# Patient Record
Sex: Male | Born: 1980 | Race: White | Hispanic: No | Marital: Single | Smoking: Former smoker
Health system: Southern US, Community
[De-identification: ages and names within clinical notes are randomized; demographics above are authoritative.]

## PROBLEM LIST (undated history)

## (undated) DIAGNOSIS — F329 Major depressive disorder, single episode, unspecified: Secondary | ICD-10-CM

## (undated) DIAGNOSIS — T7840XA Allergy, unspecified, initial encounter: Secondary | ICD-10-CM

## (undated) DIAGNOSIS — F32A Depression, unspecified: Secondary | ICD-10-CM

## (undated) DIAGNOSIS — I10 Essential (primary) hypertension: Secondary | ICD-10-CM

## (undated) DIAGNOSIS — F900 Attention-deficit hyperactivity disorder, predominantly inattentive type: Secondary | ICD-10-CM

## (undated) DIAGNOSIS — N189 Chronic kidney disease, unspecified: Secondary | ICD-10-CM

## (undated) HISTORY — DX: Allergy, unspecified, initial encounter: T78.40XA

## (undated) HISTORY — DX: Essential (primary) hypertension: I10

## (undated) HISTORY — DX: Major depressive disorder, single episode, unspecified: F32.9

## (undated) HISTORY — DX: Chronic kidney disease, unspecified: N18.9

## (undated) HISTORY — DX: Attention-deficit hyperactivity disorder, predominantly inattentive type: F90.0

## (undated) HISTORY — DX: Depression, unspecified: F32.A

---
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
## 2002-01-02 ENCOUNTER — Encounter: Admission: RE | Admit: 2002-01-02 | Discharge: 2002-04-02 | Payer: Self-pay | Admitting: Family Medicine

## 2004-03-08 ENCOUNTER — Emergency Department (HOSPITAL_COMMUNITY): Admission: EM | Admit: 2004-03-08 | Discharge: 2004-03-08 | Payer: Self-pay | Admitting: Emergency Medicine

## 2004-03-09 ENCOUNTER — Emergency Department (HOSPITAL_COMMUNITY): Admission: EM | Admit: 2004-03-09 | Discharge: 2004-03-09 | Payer: Self-pay | Admitting: Emergency Medicine

## 2006-10-23 ENCOUNTER — Ambulatory Visit (HOSPITAL_COMMUNITY): Admission: RE | Admit: 2006-10-23 | Discharge: 2006-10-23 | Payer: Self-pay | Admitting: *Deleted

## 2008-01-24 ENCOUNTER — Ambulatory Visit: Payer: Self-pay | Admitting: Internal Medicine

## 2008-01-24 LAB — CONVERTED CEMR LAB
ALT: 122 units/L — ABNORMAL HIGH (ref 0–53)
AST: 89 units/L — ABNORMAL HIGH (ref 0–37)
Alkaline Phosphatase: 75 units/L (ref 39–117)
Bilirubin, Direct: 0.1 mg/dL (ref 0.0–0.3)
Total Bilirubin: 0.9 mg/dL (ref 0.3–1.2)
Total Protein: 7.4 g/dL (ref 6.0–8.3)

## 2009-01-29 ENCOUNTER — Emergency Department (HOSPITAL_COMMUNITY): Admission: EM | Admit: 2009-01-29 | Discharge: 2009-01-29 | Payer: Self-pay | Admitting: Emergency Medicine

## 2009-12-01 ENCOUNTER — Encounter: Admission: RE | Admit: 2009-12-01 | Discharge: 2009-12-01 | Payer: Self-pay | Admitting: Gastroenterology

## 2011-02-17 LAB — POCT URINALYSIS DIP (DEVICE)
Glucose, UA: NEGATIVE mg/dL
Nitrite: NEGATIVE
Protein, ur: NEGATIVE mg/dL
Specific Gravity, Urine: 1.02 (ref 1.005–1.030)
Urobilinogen, UA: 0.2 mg/dL (ref 0.0–1.0)
pH: 7 (ref 5.0–8.0)

## 2011-03-22 NOTE — Assessment & Plan Note (Signed)
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  Other intrinsic liver processes are certainly  possible, as well, and again, pending review of the labs drawn today,  will determine other workup.     Iva Boop, MD,FACG     CEG/MedQ  DD: 01/25/2008  DT: 01/25/2008  Job #: [id number]   cc:   Garnetta Buddy, M.D.  Dr. Cleone Slim Anger

## 2011-03-25 NOTE — Assessment & Plan Note (Signed)
[facility]                         GASTROENTEROLOGY OFFICE NOTE   NAME:JONESShant, [name]                        MRN:          [id number]  DATE:01/24/2008                            DOB:          18-Jul-1981    REFERRING PHYSICIAN:  Garnetta Buddy, M.D.   PRIMARY CARE PHYSICIAN:  Bryan Lemma. Manus Gunning, M.D.   REASON FOR CONSULTATION:  Abnormal LFTs.   ASSESSMENT:  A 30 year old white male with abnormal transaminases. The  most likely etiology is alcoholic-induced transaminitis, perhaps  alcoholic hepatitis. However, his ALT is greater than his AST which goes  against that. He was drinking rather heavily, he openly admits, but now  is only drinking several beers a week on the weekends.   RECOMMENDATIONS AND PLAN:  Repeat LFTs. He has a tattoo. Will go ahead  and check a hepatitis C antibody and hepatitis B surface antigen as  well. If he has persistent increase in LFTs, further workup could  certainly be indicated. However, since he has not completely abstained  from alcohol, he would really need to do that. I will notify him of the  results.   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 He had elevated transaminases noted over time, January 28  AST 42, ALT 94. His CBC is normal. He is not diabetic. These went up to  74 and 111, respectively, on December 14, 2007. Again, he was drinking  every day of the week at that time between jobs, and now he is only  drinking a few beers to several beers on the weekends. There is no IV  drug abuse or multiple sex partners or prostitute contacts or other risk  factors for liver disease. He is not aware of any family history of  liver disease. He has no particular symptoms at this time.   PAST MEDICAL HISTORY:  1. Hypertension.  2. IgA nephropathy.  3. Dyslipidemia (his Lipitor was stopped).  4. Anxiety/depression.  5. Allergies.   PAST SURGICAL HISTORY:  Prior  tonsillectomy.   FAMILY HISTORY:  Is positive for diabetes and alcoholism.   SOCIAL HISTORY:  He is single. He is a Teacher, music at [employer]. He is a smoker. He was counseled to quit. Alcohol 4 to 5 years  on the weekend now. No drug use.   REVIEW OF SYSTEMS:  See medical history for full details.   PHYSICAL EXAMINATION:  Revealed a well-developed, well-nourished, white  man. Height 5 feet 10, weight 250 pounds. Blood pressure 144/84. His  pulse is 166 and regular. He has had a caffeinated energy drink this  morning. He is obese.  EYES:  Anicteric. ENT:  Normal mouth and pharynx.  NECK:  Supple. No thyromegaly or masses.  CHEST:  Clear.  HEART:  S1/S2. No rubs or gallops.  ABDOMEN:  Obese, soft, nontender without organomegaly or masses.  LYMPHATIC:  No neck or supraclavicular nodes.  EXTREMITIES:  Free of edema.  SKIN:  No stigmata of chronic liver disease. There is a tattoo on the  left arm. There is  no rash.  NEUROLOGICAL:  Cranial nerves II-XII intact.  PSYCH:  Alert and oriented x3. Appropriate mood and affect.   I reviewed the office notes and labs from Dr. Hyman Hopes. I appreciate the  opportunity to care for this patient.   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  Other intrinsic liver  processes are certainly possible, as well, and again, pending review of  the labs drawn today, will determine other workup.     Iva Boop, MD,FACG  Electronically Signed    CEG/MedQ  DD: 01/25/2008  DT: 01/25/2008  Job #: [id number]   cc:   Garnetta Buddy, M.D.  Bryan Lemma. Manus Gunning, M.D.

## 2011-12-16 IMAGING — US US ABDOMEN COMPLETE
1 series · 13 of 25 positions shown · non-contrast
Comparison: None.

CLINICAL DATA: Elevated liver function tests.

COMPLETE ABDOMINAL ULTRASOUND 12/01/2009:

[Series 1: us abdomen complete · 0.31mm/px · 13 of 63 slices shown]
[im 1/63]
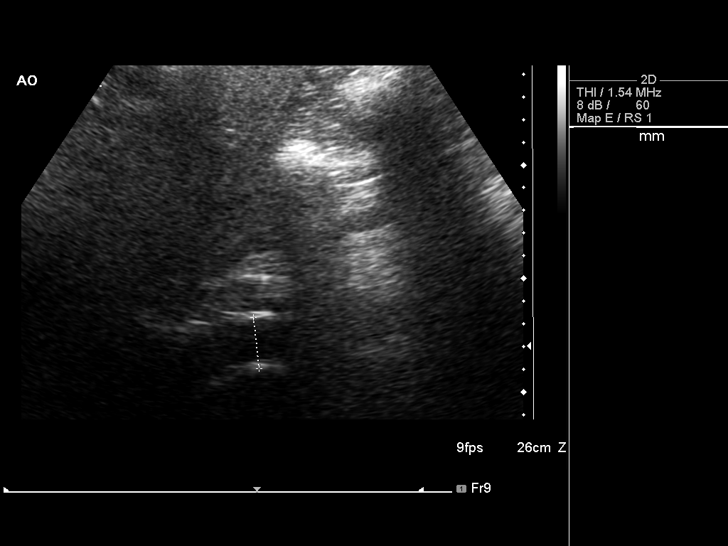
[im 6/63]
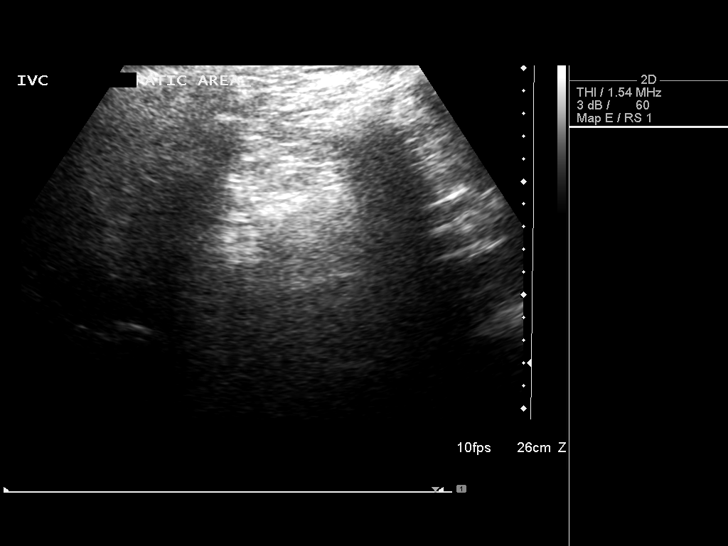
[im 11/63]
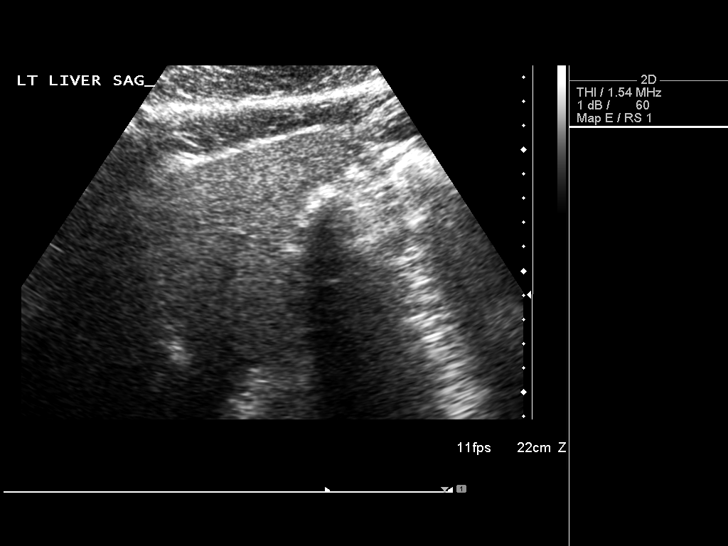
[im 16/63]
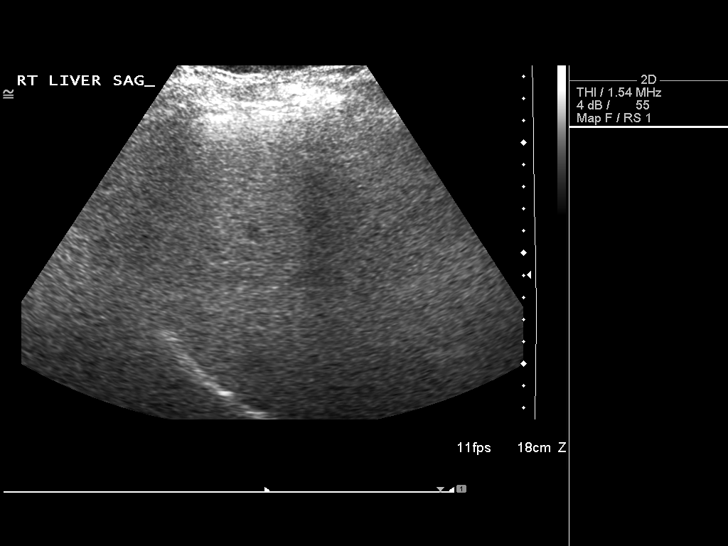
[im 21/63]
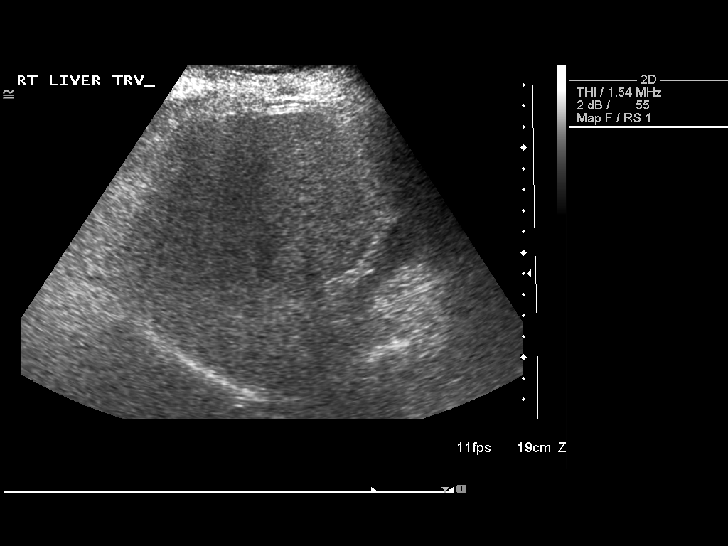
[im 26/63]
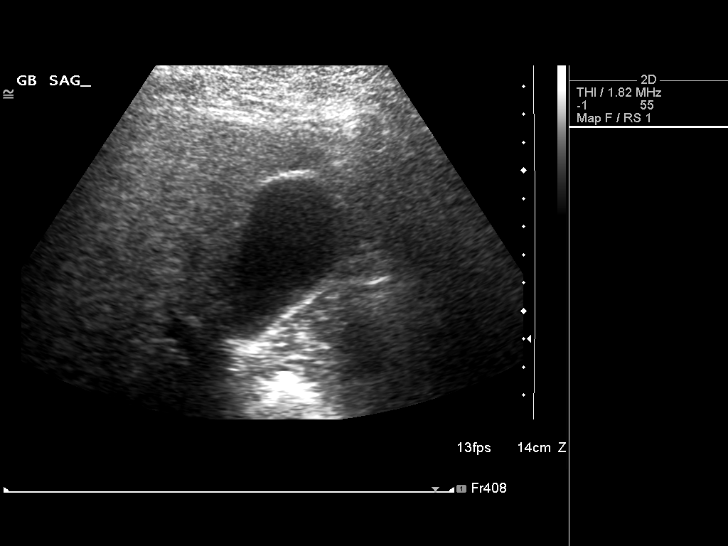
[im 32/63]
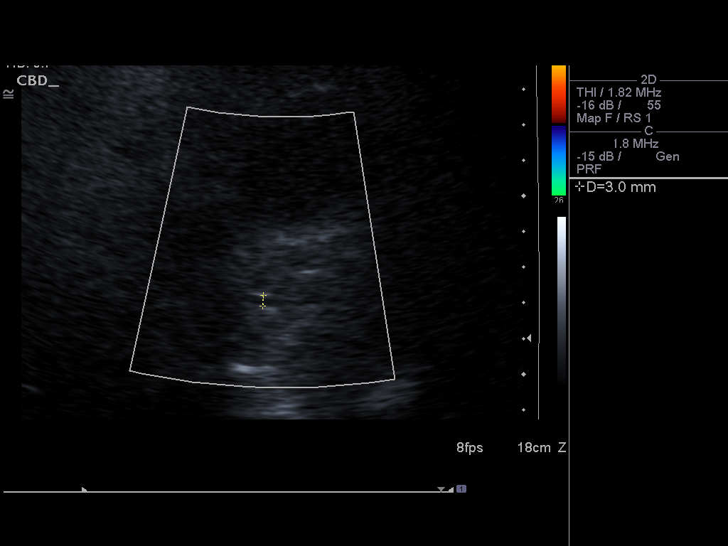
[im 37/63]
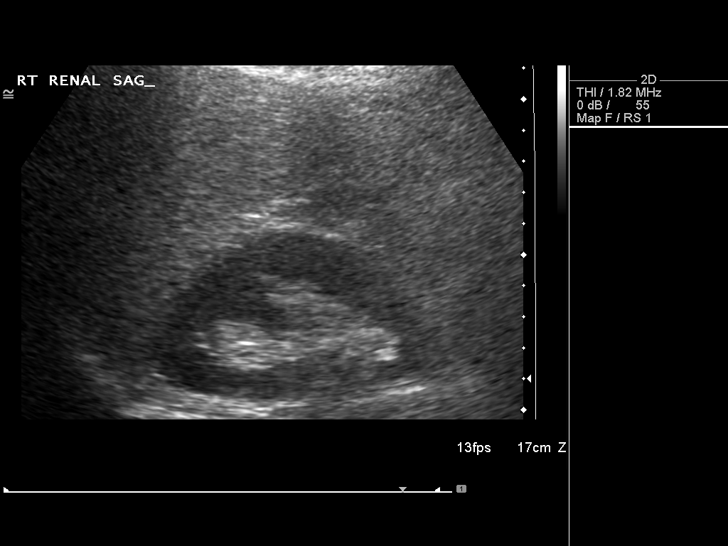
[im 42/63]
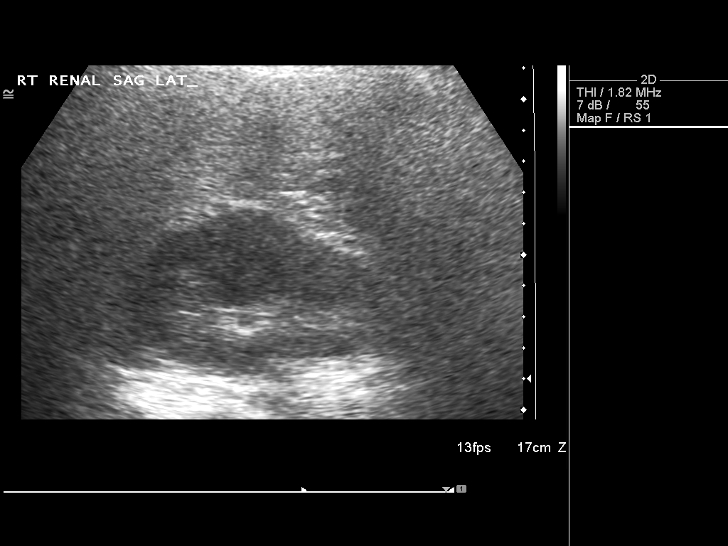
[im 47/63]
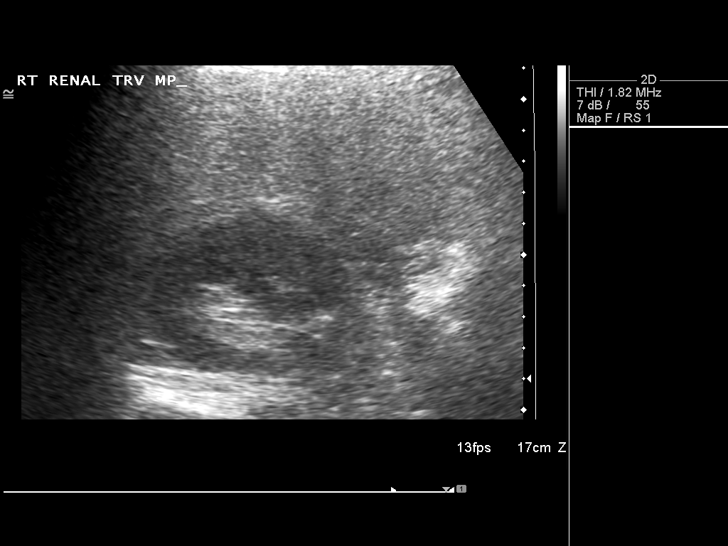
[im 52/63]
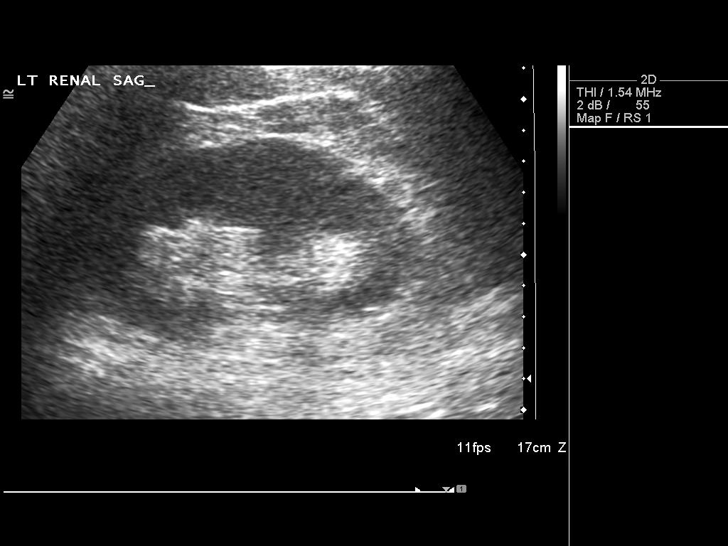
[im 57/63]
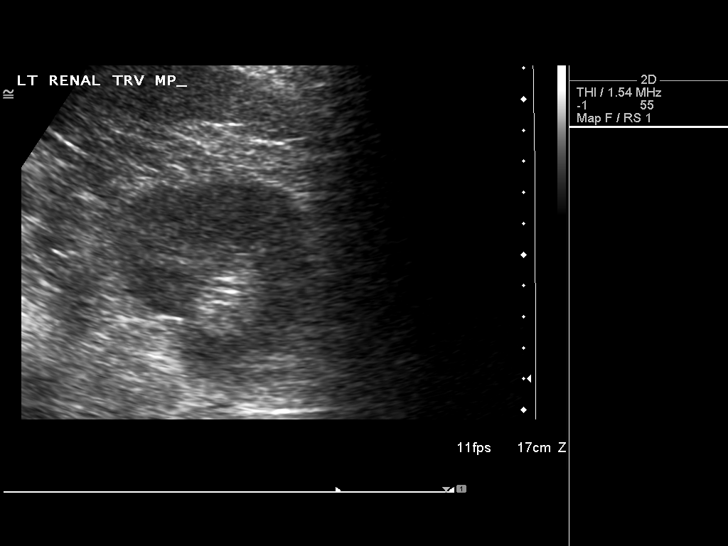
[im 63/63]
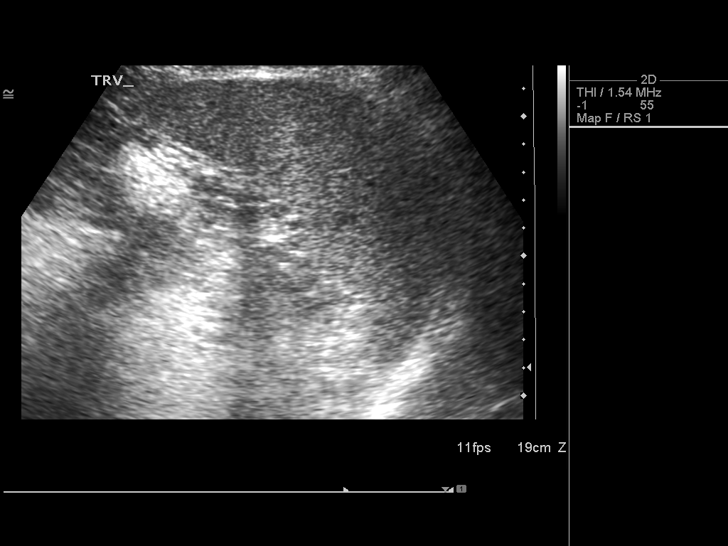

[13 of 25 positions shown; findings below may reference images not displayed]

FINDINGS: Gallbladder:  No shadowing gallstones or echogenic sludge.  No
gallbladder wall thickening or pericholecystic fluid.  Negative
sonographic Murphy's sign according to the ultrasound technologist.

Common bile duct:  Normal in caliber with maximum diameter
approximating 3 mm.

Liver:  Diffusely increased and coarsened echotexture without focal
parenchymal abnormality.  Patent portal vein.

IVC:  Patent in its intrahepatic portion.  Obscured outside the
liver by overlying bowel gas.

Pancreas:  Although the pancreas is difficult to visualize in its
entirety, no focal pancreatic abnormality is identified. The tail
was obscured by overlying bowel gas.

Spleen:  Normal size and echotexture without focal parenchymal
abnormality.

Right Kidney:  No hydronephrosis.  Well-preserved cortex.  Normal
size and parenchymal echotexture without focal abnormalities.
Approximately 10.7 cm length.

Left Kidney:  No hydronephrosis.  Well-preserved cortex.  Normal
size and parenchymal echotexture without focal abnormalities.
Approximately 10.4 cm length.

Abdominal aorta:  Normal in caliber throughout its visualized
course in the abdomen. The common iliac arteries were obscured by
overlying bowel gas.
IMPRESSION: 1.  Diffuse fatty infiltration of the liver versus hepatocellular
disease.  No focal hepatic parenchymal abnormalities.
2.  Otherwise normal abdominal ultrasound with a caveat that the
extrahepatic IVC and pancreatic tail were obscured by overlying
bowel gas and were therefore not evaluated.

## 2016-02-24 DIAGNOSIS — H17822 Peripheral opacity of cornea, left eye: Secondary | ICD-10-CM | POA: Diagnosis not present

## 2016-02-24 DIAGNOSIS — H01001 Unspecified blepharitis right upper eyelid: Secondary | ICD-10-CM | POA: Diagnosis not present

## 2016-02-24 DIAGNOSIS — H01002 Unspecified blepharitis right lower eyelid: Secondary | ICD-10-CM | POA: Diagnosis not present

## 2016-02-24 DIAGNOSIS — H01004 Unspecified blepharitis left upper eyelid: Secondary | ICD-10-CM | POA: Diagnosis not present

## 2016-03-30 DIAGNOSIS — F900 Attention-deficit hyperactivity disorder, predominantly inattentive type: Secondary | ICD-10-CM | POA: Diagnosis not present

## 2016-06-01 DIAGNOSIS — F900 Attention-deficit hyperactivity disorder, predominantly inattentive type: Secondary | ICD-10-CM | POA: Diagnosis not present

## 2016-09-13 DIAGNOSIS — E785 Hyperlipidemia, unspecified: Secondary | ICD-10-CM | POA: Diagnosis not present

## 2016-09-13 DIAGNOSIS — N028 Recurrent and persistent hematuria with other morphologic changes: Secondary | ICD-10-CM | POA: Diagnosis not present

## 2016-09-13 DIAGNOSIS — Z23 Encounter for immunization: Secondary | ICD-10-CM | POA: Diagnosis not present

## 2016-09-13 DIAGNOSIS — Z720 Tobacco use: Secondary | ICD-10-CM | POA: Diagnosis not present

## 2016-10-04 DIAGNOSIS — F419 Anxiety disorder, unspecified: Secondary | ICD-10-CM | POA: Diagnosis not present

## 2016-10-04 DIAGNOSIS — F900 Attention-deficit hyperactivity disorder, predominantly inattentive type: Secondary | ICD-10-CM | POA: Diagnosis not present

## 2016-11-14 DIAGNOSIS — F900 Attention-deficit hyperactivity disorder, predominantly inattentive type: Secondary | ICD-10-CM | POA: Diagnosis not present

## 2017-01-10 DIAGNOSIS — F900 Attention-deficit hyperactivity disorder, predominantly inattentive type: Secondary | ICD-10-CM | POA: Diagnosis not present

## 2017-10-11 DIAGNOSIS — N028 Recurrent and persistent hematuria with other morphologic changes: Secondary | ICD-10-CM | POA: Diagnosis not present

## 2017-10-11 DIAGNOSIS — R809 Proteinuria, unspecified: Secondary | ICD-10-CM | POA: Diagnosis not present

## 2017-10-11 DIAGNOSIS — E785 Hyperlipidemia, unspecified: Secondary | ICD-10-CM | POA: Diagnosis not present

## 2017-10-11 DIAGNOSIS — I10 Essential (primary) hypertension: Secondary | ICD-10-CM | POA: Diagnosis not present

## 2017-10-17 ENCOUNTER — Ambulatory Visit: Payer: Self-pay | Admitting: Family Medicine

## 2017-10-25 DIAGNOSIS — N028 Recurrent and persistent hematuria with other morphologic changes: Secondary | ICD-10-CM | POA: Diagnosis not present

## 2017-10-25 DIAGNOSIS — R634 Abnormal weight loss: Secondary | ICD-10-CM | POA: Diagnosis not present

## 2017-10-25 DIAGNOSIS — I10 Essential (primary) hypertension: Secondary | ICD-10-CM | POA: Diagnosis not present

## 2017-10-25 DIAGNOSIS — R809 Proteinuria, unspecified: Secondary | ICD-10-CM | POA: Diagnosis not present

## 2017-10-27 ENCOUNTER — Encounter: Payer: Self-pay | Admitting: Family Medicine

## 2017-10-27 ENCOUNTER — Ambulatory Visit: Payer: BLUE CROSS/BLUE SHIELD | Admitting: Family Medicine

## 2017-10-27 VITALS — BP 130/60 | HR 105 | Temp 98.3°F | Ht 69.5 in | Wt 301.0 lb

## 2017-10-27 DIAGNOSIS — F900 Attention-deficit hyperactivity disorder, predominantly inattentive type: Secondary | ICD-10-CM

## 2017-10-27 DIAGNOSIS — N289 Disorder of kidney and ureter, unspecified: Secondary | ICD-10-CM | POA: Diagnosis not present

## 2017-10-27 DIAGNOSIS — Z23 Encounter for immunization: Secondary | ICD-10-CM

## 2017-10-27 DIAGNOSIS — I10 Essential (primary) hypertension: Secondary | ICD-10-CM

## 2017-10-27 DIAGNOSIS — F418 Other specified anxiety disorders: Secondary | ICD-10-CM | POA: Diagnosis not present

## 2017-10-27 MED ORDER — FLUOXETINE HCL 20 MG PO CAPS: 20.0000 mg | ORAL_CAPSULE | Freq: Every day | ORAL | 3 refills | Status: DC

## 2017-10-30 ENCOUNTER — Encounter: Payer: Self-pay | Admitting: Family Medicine

## 2017-10-30 DIAGNOSIS — N289 Disorder of kidney and ureter, unspecified: Secondary | ICD-10-CM | POA: Insufficient documentation

## 2017-10-30 DIAGNOSIS — F418 Other specified anxiety disorders: Secondary | ICD-10-CM | POA: Insufficient documentation

## 2017-10-30 DIAGNOSIS — F900 Attention-deficit hyperactivity disorder, predominantly inattentive type: Secondary | ICD-10-CM | POA: Insufficient documentation

## 2017-10-30 DIAGNOSIS — I10 Essential (primary) hypertension: Secondary | ICD-10-CM | POA: Insufficient documentation

## 2017-10-30 NOTE — Progress Notes (Signed)
   Subjective:    Patient ID: [name], male    DOB: 1981/08/26, 36 y.o.   MRN: [id number]  HPI 36 yr old male to establish with us. He has not had a primary care provider for many years, though he sees Dr. Elvis CoilMartin Webb several times a year for his kidney disease and his BP. He was diagnosed with kidney disease as a teenager but he cannot remember what type it is. Apparently it is stable and his BP has been well controlled with Lisinopril. He has been treated for ADHD and depression in the past by some one in the psychiatric group with Dr. Emerson MonteParrish McKinney, but currently he is not treating these. He had been taking Adderall and Prozac until last year. He feels well and has no complaints. 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    Review of Systems  Constitutional: Negative.   Respiratory: Negative.   Cardiovascular: Negative.   Neurological: Negative.   Psychiatric/Behavioral: Negative.        Objective:   Physical Exam  Constitutional: He is oriented to person, place, and time.  Morbidly obese   Neck: No thyromegaly present.  Cardiovascular: Normal rate, regular rhythm, normal heart sounds and intact distal pulses.  Pulmonary/Chest: Breath sounds normal. No respiratory distress. He has no wheezes. He has no rales.  Lymphadenopathy:    He has no cervical adenopathy.  Neurological: He is alert and oriented to person, place, and time.  Psychiatric: He has a normal mood and affect. His behavior is normal. Thought content normal.          Assessment & Plan:  Intro visit for this patient with kidney disease and HTN. We refilled his Lisinopril. We will get records from Dr. Hyman HopesWebb as to the nature of his kidney disease. He knows he needs to lose weight. His depression and ADHD seem to be under control. He will return soon for a complete exam and fasting labs.  Gershon CraneStephen Enslie Sahota, MD

## 2018-10-19 ENCOUNTER — Other Ambulatory Visit: Payer: Self-pay | Admitting: Family Medicine

## 2018-10-19 NOTE — Telephone Encounter (Signed)
Copied from CRM [phone]. Topic: Quick Communication - Rx Refill/Question >> Oct 19, 2018  5:26 PM Floria RavelingStovall, Shana A wrote: Medication: FLUoxetine (PROZAC) 20 MG capsule [[id number]][[id number]]  Has the patient contacted their pharmacy? Yes pt was last seen 10/27/2017 pt said he can not come in before the end of the year due to his job but does not to run out of this med.  Will make an appt for jan (Agent: If no, request that the patient contact the pharmacy for the refill.) (Agent: If yes, when and what did the pharmacy advise?)  Preferred Pharmacy (with phone number or street name): [id number] - [place], [place] - [address] AT [facility] [address]  Agent: Please be advised that RX refills may take up to 3 business days. We ask that you follow-up with your pharmacy.

## 2018-10-22 MED ORDER — FLUOXETINE HCL 20 MG PO CAPS: 20.0000 mg | ORAL_CAPSULE | Freq: Every day | ORAL | 0 refills | Status: DC

## 2018-11-12 ENCOUNTER — Encounter: Payer: Self-pay | Admitting: Family Medicine

## 2018-11-12 ENCOUNTER — Ambulatory Visit: Payer: BLUE CROSS/BLUE SHIELD | Admitting: Family Medicine

## 2018-11-12 VITALS — BP 142/84 | HR 112 | Temp 98.1°F | Wt 271.0 lb

## 2018-11-12 DIAGNOSIS — N289 Disorder of kidney and ureter, unspecified: Secondary | ICD-10-CM

## 2018-11-12 DIAGNOSIS — I10 Essential (primary) hypertension: Secondary | ICD-10-CM | POA: Diagnosis not present

## 2018-11-12 DIAGNOSIS — F418 Other specified anxiety disorders: Secondary | ICD-10-CM

## 2018-11-12 DIAGNOSIS — Z23 Encounter for immunization: Secondary | ICD-10-CM | POA: Diagnosis not present

## 2018-11-12 MED ORDER — FLUOXETINE HCL 20 MG PO CAPS: 20.0000 mg | ORAL_CAPSULE | Freq: Every day | ORAL | 3 refills | Status: DC

## 2018-11-12 MED ORDER — LISINOPRIL 20 MG PO TABS: 20.0000 mg | ORAL_TABLET | Freq: Two times a day (BID) | ORAL | 3 refills | Status: DC

## 2018-11-12 NOTE — Progress Notes (Signed)
   Subjective:    Patient ID: [name], male    DOB: Feb 28, 1981, 38 y.o.   MRN: [id number]  HPI Here to follow up. He feels great. He sees Nephrology once a year in late January, and he has labs done with them. Last year he quit smoking cigarettes for awhile and used e cigs instead, but now he is back to cigarettes. He followed a keto diet and lost 60 lbs, but now he is back to a regular diet. He asks about vaccines needed for a trip to [place] he has planned for July.    Review of Systems  Constitutional: Negative.   Respiratory: Negative.   Cardiovascular: Negative.   Neurological: Negative.   Psychiatric/Behavioral: Negative.        Objective:   Physical Exam Constitutional:      Appearance: Normal appearance.  Cardiovascular:     Rate and Rhythm: Normal rate and regular rhythm.     Pulses: Normal pulses.     Heart sounds: Normal heart sounds.  Pulmonary:     Effort: Pulmonary effort is normal.     Breath sounds: Normal breath sounds.  Musculoskeletal:     Right lower leg: No edema.     Left lower leg: No edema.  Neurological:     General: No focal deficit present.     Mental Status: He is alert and oriented to person, place, and time.  Psychiatric:        Mood and Affect: Mood normal.        Thought Content: Thought content normal.        Judgment: Judgment normal.           Assessment & Plan:  His HTN is stable. His depression and anxiety are well controlled. He is given a flu shot and a TDaP. For other travel vaccines, we will refer him to the Health Dept.  Gershon Crane, MD

## 2018-11-12 NOTE — Addendum Note (Signed)
Addended by: Marcellus Scott on: 11/12/2018 04:17 PM   Modules accepted: Orders

## 2019-08-06 DIAGNOSIS — M79644 Pain in right finger(s): Secondary | ICD-10-CM | POA: Diagnosis not present

## 2019-08-06 DIAGNOSIS — M20011 Mallet finger of right finger(s): Secondary | ICD-10-CM | POA: Diagnosis not present

## 2019-09-09 DIAGNOSIS — M20011 Mallet finger of right finger(s): Secondary | ICD-10-CM | POA: Diagnosis not present

## 2019-10-26 DIAGNOSIS — R0981 Nasal congestion: Secondary | ICD-10-CM | POA: Diagnosis not present

## 2019-10-26 DIAGNOSIS — R05 Cough: Secondary | ICD-10-CM | POA: Diagnosis not present

## 2019-10-26 DIAGNOSIS — Z20828 Contact with and (suspected) exposure to other viral communicable diseases: Secondary | ICD-10-CM | POA: Diagnosis not present

## 2019-11-07 ENCOUNTER — Other Ambulatory Visit: Payer: Self-pay | Admitting: Family Medicine

## 2019-11-19 ENCOUNTER — Telehealth (INDEPENDENT_AMBULATORY_CARE_PROVIDER_SITE_OTHER): Payer: BC Managed Care – PPO | Admitting: Family Medicine

## 2019-11-19 ENCOUNTER — Other Ambulatory Visit: Payer: Self-pay

## 2019-11-19 DIAGNOSIS — F418 Other specified anxiety disorders: Secondary | ICD-10-CM

## 2019-11-19 DIAGNOSIS — I10 Essential (primary) hypertension: Secondary | ICD-10-CM | POA: Diagnosis not present

## 2019-11-19 MED ORDER — LISINOPRIL 20 MG PO TABS: 20.0000 mg | ORAL_TABLET | Freq: Two times a day (BID) | ORAL | 3 refills | Status: DC

## 2019-11-19 MED ORDER — FLUOXETINE HCL 20 MG PO CAPS: 20.0000 mg | ORAL_CAPSULE | Freq: Every day | ORAL | 3 refills | Status: DC

## 2019-11-19 NOTE — Progress Notes (Signed)
Virtual Visit via Telephone Note  I connected with the patient on 11/19/19 at 11:00 AM EST by telephone and verified that I am speaking with the correct person using two identifiers.   I discussed the limitations, risks, security and privacy concerns of performing an evaluation and management service by telephone and the availability of in person appointments. I also discussed with the patient that there may be a patient responsible charge related to this service. The patient expressed understanding and agreed to proceed.  Location patient: home Location provider: work or home office Participants present for the call: patient, provider Patient did not have a visit in the prior 7 days to address this/these issue(s).   History of Present Illness: Here for medication refills. He takes Prozac for depression and anxiety, and this is working very well for him. His moods are stable, appetite and sleep are intact. He has not checked his BP for awhile however. He exercises and tries to eat a healthy diet.    Observations/Objective: Patient sounds cheerful and well on the phone. I do not appreciate any SOB. Speech and thought processing are grossly intact. Patient reported vitals:  Assessment and Plan: Follow up on depression with anxiety and HTN. We will refill both medications. I asked him to obtain a home BP cuff and to check the BP several times a month. He will report back to [place] how his BP is running in the next few weeks.  Gershon Crane, MD   Follow Up Instructions:     [phone] 5-10 [phone] 11-20 [phone] 21-30 I did not refer this patient for an OV in the next 24 hours for this/these issue(s).  I discussed the assessment and treatment plan with the patient. The patient was provided an opportunity to ask questions and all were answered. The patient agreed with the plan and demonstrated an understanding of the instructions.   The patient was advised to call back or seek an in-person evaluation  if the symptoms worsen or if the condition fails to improve as anticipated.  I provided of non-face-to-face time during this encounter.   Gershon Crane, MD

## 2020-05-10 DIAGNOSIS — R7989 Other specified abnormal findings of blood chemistry: Secondary | ICD-10-CM | POA: Diagnosis not present

## 2020-05-10 DIAGNOSIS — R404 Transient alteration of awareness: Secondary | ICD-10-CM | POA: Diagnosis not present

## 2020-05-10 DIAGNOSIS — T40991A Poisoning by other psychodysleptics [hallucinogens], accidental (unintentional), initial encounter: Secondary | ICD-10-CM | POA: Diagnosis not present

## 2020-05-10 DIAGNOSIS — F17210 Nicotine dependence, cigarettes, uncomplicated: Secondary | ICD-10-CM | POA: Diagnosis not present

## 2020-05-10 DIAGNOSIS — R410 Disorientation, unspecified: Secondary | ICD-10-CM | POA: Diagnosis not present

## 2020-05-10 DIAGNOSIS — R441 Visual hallucinations: Secondary | ICD-10-CM | POA: Diagnosis not present

## 2020-05-10 DIAGNOSIS — R443 Hallucinations, unspecified: Secondary | ICD-10-CM | POA: Diagnosis not present

## 2020-05-10 DIAGNOSIS — Z885 Allergy status to narcotic agent status: Secondary | ICD-10-CM | POA: Diagnosis not present

## 2020-05-10 DIAGNOSIS — R945 Abnormal results of liver function studies: Secondary | ICD-10-CM | POA: Diagnosis not present

## 2020-05-10 DIAGNOSIS — Z79899 Other long term (current) drug therapy: Secondary | ICD-10-CM | POA: Diagnosis not present

## 2020-05-10 DIAGNOSIS — T620X1A Toxic effect of ingested mushrooms, accidental (unintentional), initial encounter: Secondary | ICD-10-CM | POA: Diagnosis not present

## 2020-07-07 DIAGNOSIS — M205X2 Other deformities of toe(s) (acquired), left foot: Secondary | ICD-10-CM | POA: Diagnosis not present

## 2020-07-07 DIAGNOSIS — M205X1 Other deformities of toe(s) (acquired), right foot: Secondary | ICD-10-CM | POA: Diagnosis not present

## 2020-07-15 DIAGNOSIS — M205X2 Other deformities of toe(s) (acquired), left foot: Secondary | ICD-10-CM | POA: Diagnosis not present

## 2020-07-15 DIAGNOSIS — M205X1 Other deformities of toe(s) (acquired), right foot: Secondary | ICD-10-CM | POA: Diagnosis not present

## 2020-08-12 DIAGNOSIS — M19072 Primary osteoarthritis, left ankle and foot: Secondary | ICD-10-CM | POA: Diagnosis not present

## 2020-08-12 DIAGNOSIS — M205X2 Other deformities of toe(s) (acquired), left foot: Secondary | ICD-10-CM | POA: Diagnosis not present

## 2020-08-12 DIAGNOSIS — M205X1 Other deformities of toe(s) (acquired), right foot: Secondary | ICD-10-CM | POA: Diagnosis not present

## 2020-08-12 DIAGNOSIS — M19071 Primary osteoarthritis, right ankle and foot: Secondary | ICD-10-CM | POA: Diagnosis not present

## 2020-11-10 ENCOUNTER — Other Ambulatory Visit: Payer: Self-pay | Admitting: Family Medicine

## 2020-11-11 NOTE — Telephone Encounter (Signed)
Last office visit- 11/19/2019 No future office visit is scheduled

## 2021-03-02 ENCOUNTER — Ambulatory Visit (INDEPENDENT_AMBULATORY_CARE_PROVIDER_SITE_OTHER): Payer: BC Managed Care – PPO | Admitting: Family Medicine

## 2021-03-02 ENCOUNTER — Other Ambulatory Visit: Payer: Self-pay

## 2021-03-02 ENCOUNTER — Encounter: Payer: Self-pay | Admitting: Family Medicine

## 2021-03-02 VITALS — BP 126/82 | Temp 98.2°F | Ht 71.0 in | Wt 292.0 lb

## 2021-03-02 DIAGNOSIS — Z0000 Encounter for general adult medical examination without abnormal findings: Secondary | ICD-10-CM | POA: Diagnosis not present

## 2021-03-02 LAB — CBC WITH DIFFERENTIAL/PLATELET
Basophils Absolute: 0 10*3/uL (ref 0.0–0.1)
Basophils Relative: 0.4 % (ref 0.0–3.0)
Eosinophils Absolute: 0.2 10*3/uL (ref 0.0–0.7)
Eosinophils Relative: 3.1 % (ref 0.0–5.0)
HCT: 44 % (ref 39.0–52.0)
Hemoglobin: 15.1 g/dL (ref 13.0–17.0)
Lymphocytes Relative: 22.8 % (ref 12.0–46.0)
Lymphs Abs: 1.4 10*3/uL (ref 0.7–4.0)
MCHC: 34.3 g/dL (ref 30.0–36.0)
MCV: 90.5 fl (ref 78.0–100.0)
Monocytes Absolute: 0.6 10*3/uL (ref 0.1–1.0)
Monocytes Relative: 9.6 % (ref 3.0–12.0)
Neutro Abs: 4 10*3/uL (ref 1.4–7.7)
Neutrophils Relative %: 64.1 % (ref 43.0–77.0)
Platelets: 266 10*3/uL (ref 150.0–400.0)
RBC: 4.86 Mil/uL (ref 4.22–5.81)
RDW: 12.3 % (ref 11.5–15.5)
WBC: 6.2 10*3/uL (ref 4.0–10.5)

## 2021-03-02 LAB — HEPATIC FUNCTION PANEL
ALT: 41 U/L (ref 0–53)
AST: 22 U/L (ref 0–37)
Albumin: 4.1 g/dL (ref 3.5–5.2)
Alkaline Phosphatase: 77 U/L (ref 39–117)
Bilirubin, Direct: 0.1 mg/dL (ref 0.0–0.3)
Total Bilirubin: 0.4 mg/dL (ref 0.2–1.2)
Total Protein: 7.2 g/dL (ref 6.0–8.3)

## 2021-03-02 LAB — LIPID PANEL
Cholesterol: 234 mg/dL — ABNORMAL HIGH (ref 0–200)
HDL: 51.3 mg/dL (ref >39.00)
NonHDL: 183
Total CHOL/HDL Ratio: 5
Triglycerides: 266 mg/dL — ABNORMAL HIGH (ref 0.0–149.0)
VLDL: 53.2 mg/dL — ABNORMAL HIGH (ref 0.0–40.0)

## 2021-03-02 LAB — TSH: TSH: 1.41 u[IU]/mL (ref 0.35–4.50)

## 2021-03-02 LAB — T3, FREE: T3, Free: 3.4 pg/mL (ref 2.3–4.2)

## 2021-03-02 LAB — HEMOGLOBIN A1C: Hgb A1c MFr Bld: 6 % (ref 4.6–6.5)

## 2021-03-02 LAB — BASIC METABOLIC PANEL
BUN: 10 mg/dL (ref 6–23)
CO2: 27 mEq/L (ref 19–32)
Calcium: 9.4 mg/dL (ref 8.4–10.5)
Chloride: 100 mEq/L (ref 96–112)
Creatinine, Ser: 0.75 mg/dL (ref 0.40–1.50)
GFR: 114.76 mL/min (ref >60.00)
Glucose, Bld: 101 mg/dL — ABNORMAL HIGH (ref 70–99)
Potassium: 4.7 mEq/L (ref 3.5–5.1)
Sodium: 137 mEq/L (ref 135–145)

## 2021-03-02 LAB — LDL CHOLESTEROL, DIRECT: Direct LDL: 168 mg/dL

## 2021-03-02 LAB — T4, FREE: Free T4: 0.7 ng/dL (ref 0.60–1.60)

## 2021-03-02 NOTE — Progress Notes (Signed)
Subjective:    Patient ID: [name], male    DOB: 12-28-1980, 40 y.o.   MRN: [id number]  HPI Here for a well exam. He feels fine in general. He asks about a reddish coloration to his face that appeared about a year ago. This does not itch or burn. He notes that he sees "tiny blood vessels" in the skin around his nose. He says he had been drinking alcohol fairly heavily for several years, including liquor and beer. He has made an effort to be healthier in the past 6 months, and part of this has been to greatly reduce his alcohol use. He now drinks no liquor, and he only has 2-3 beers a week. He has changed his diet and he is exercising, and he has lost about 30 lbs of weight. He has also quit smoking. Now he says the red on his face has faded quite a bit. I see he had labs drawn at a [facility] in July of 2021, and his ALT was elevated at 91 and the AST was up to 44.    Review of Systems  Constitutional: Negative.   HENT: Negative.   Eyes: Negative.   Respiratory: Negative.   Cardiovascular: Negative.   Gastrointestinal: Negative.   Genitourinary: Negative.   Musculoskeletal: Negative.   Skin: Negative.   Neurological: Negative.   Psychiatric/Behavioral: Negative.        Objective:   Physical Exam Constitutional:      General: He is not in acute distress.    Appearance: He is well-developed. He is obese. He is not diaphoretic.  HENT:     Head: Normocephalic and atraumatic.     Right Ear: External ear normal.     Left Ear: External ear normal.     Nose: Nose normal.     Mouth/Throat:     Pharynx: No oropharyngeal exudate.  Eyes:     General: No scleral icterus.       Right eye: No discharge.        Left eye: No discharge.     Conjunctiva/sclera: Conjunctivae normal.     Pupils: Pupils are equal, round, and reactive to light.  Neck:     Thyroid: No thyromegaly.     Vascular: No JVD.     Trachea: No tracheal deviation.  Cardiovascular:     Rate and  Rhythm: Normal rate and regular rhythm.     Heart sounds: Normal heart sounds. No murmur heard. No friction rub. No gallop.   Pulmonary:     Effort: Pulmonary effort is normal. No respiratory distress.     Breath sounds: Normal breath sounds. No wheezing or rales.  Chest:     Chest wall: No tenderness.  Abdominal:     General: Bowel sounds are normal. There is no distension.     Palpations: Abdomen is soft. There is no mass.     Tenderness: There is no abdominal tenderness. There is no guarding or rebound.  Genitourinary:    Penis: Normal. No tenderness.      Testes: Normal.  Musculoskeletal:        General: No tenderness. Normal range of motion.     Cervical back: Neck supple.  Lymphadenopathy:     Cervical: No cervical adenopathy.  Skin:    General: Skin is warm and dry.     Coloration: Skin is not pale.     Findings: No erythema or rash.     Comments: There is slight erythema to  the face with a few capillaries visible over the nose  Neurological:     Mental Status: He is alert and oriented to person, place, and time.     Cranial Nerves: No cranial nerve deficit.     Motor: No abnormal muscle tone.     Coordination: Coordination normal.     Deep Tendon Reflexes: Reflexes are normal and symmetric. Reflexes normal.  Psychiatric:        Behavior: Behavior normal.        Thought Content: Thought content normal.        Judgment: Judgment normal.           Assessment & Plan:  Well exam. We discussed diet and exercise. Get fasting labs. I congratulated him on his healthier lifestyle. The facial erythema and neovascularization is likely reflective of some liver inflammation from alcohol use. We will follow this closely.  Gershon Crane, MD

## 2021-03-03 ENCOUNTER — Other Ambulatory Visit: Payer: Self-pay

## 2021-03-03 DIAGNOSIS — E785 Hyperlipidemia, unspecified: Secondary | ICD-10-CM

## 2021-03-05 ENCOUNTER — Other Ambulatory Visit: Payer: Self-pay

## 2021-03-05 MED ORDER — METRONIDAZOLE 1 % EX GEL: Freq: Every day | CUTANEOUS | 3 refills | Status: DC

## 2021-08-23 ENCOUNTER — Other Ambulatory Visit (INDEPENDENT_AMBULATORY_CARE_PROVIDER_SITE_OTHER): Payer: BC Managed Care – PPO

## 2021-08-23 ENCOUNTER — Other Ambulatory Visit: Payer: Self-pay

## 2021-08-23 DIAGNOSIS — E785 Hyperlipidemia, unspecified: Secondary | ICD-10-CM

## 2021-08-23 LAB — LIPID PANEL
Cholesterol: 198 mg/dL (ref 0–200)
HDL: 44.3 mg/dL (ref >39.00)
LDL Cholesterol: 125 mg/dL — ABNORMAL HIGH (ref 0–99)
NonHDL: 153.77
Total CHOL/HDL Ratio: 4
Triglycerides: 145 mg/dL (ref 0.0–149.0)
VLDL: 29 mg/dL (ref 0.0–40.0)

## 2021-08-27 DIAGNOSIS — L219 Seborrheic dermatitis, unspecified: Secondary | ICD-10-CM | POA: Diagnosis not present

## 2021-08-27 DIAGNOSIS — L718 Other rosacea: Secondary | ICD-10-CM | POA: Diagnosis not present

## 2021-09-01 ENCOUNTER — Telehealth: Payer: Self-pay

## 2021-09-01 NOTE — Telephone Encounter (Signed)
Spoke with patient about 08/23/21, lab results.  Voiced understanding

## 2021-10-19 DIAGNOSIS — L209 Atopic dermatitis, unspecified: Secondary | ICD-10-CM | POA: Diagnosis not present

## 2021-10-19 DIAGNOSIS — L718 Other rosacea: Secondary | ICD-10-CM | POA: Diagnosis not present

## 2021-12-04 ENCOUNTER — Other Ambulatory Visit: Payer: Self-pay | Admitting: Family Medicine

## 2021-12-12 ENCOUNTER — Other Ambulatory Visit: Payer: Self-pay | Admitting: Family Medicine

## 2022-03-04 ENCOUNTER — Ambulatory Visit (INDEPENDENT_AMBULATORY_CARE_PROVIDER_SITE_OTHER): Payer: BC Managed Care – PPO | Admitting: Family Medicine

## 2022-03-04 ENCOUNTER — Encounter: Payer: Self-pay | Admitting: Family Medicine

## 2022-03-04 ENCOUNTER — Other Ambulatory Visit: Payer: Self-pay

## 2022-03-04 VITALS — BP 124/84 | HR 70 | Temp 98.2°F | Ht 70.0 in | Wt 275.4 lb

## 2022-03-04 DIAGNOSIS — Z0000 Encounter for general adult medical examination without abnormal findings: Secondary | ICD-10-CM

## 2022-03-04 DIAGNOSIS — E785 Hyperlipidemia, unspecified: Secondary | ICD-10-CM

## 2022-03-04 LAB — BASIC METABOLIC PANEL
BUN: 10 mg/dL (ref 6–23)
CO2: 30 mEq/L (ref 19–32)
Calcium: 9.6 mg/dL (ref 8.4–10.5)
Chloride: 97 mEq/L (ref 96–112)
Creatinine, Ser: 0.79 mg/dL (ref 0.40–1.50)
GFR: 112.18 mL/min (ref >60.00)
Glucose, Bld: 98 mg/dL (ref 70–99)
Potassium: 4.1 mEq/L (ref 3.5–5.1)
Sodium: 136 mEq/L (ref 135–145)

## 2022-03-04 LAB — HEPATIC FUNCTION PANEL
ALT: 28 U/L (ref 0–53)
AST: 23 U/L (ref 0–37)
Albumin: 4.5 g/dL (ref 3.5–5.2)
Alkaline Phosphatase: 71 U/L (ref 39–117)
Bilirubin, Direct: 0.1 mg/dL (ref 0.0–0.3)
Total Bilirubin: 0.7 mg/dL (ref 0.2–1.2)
Total Protein: 7.4 g/dL (ref 6.0–8.3)

## 2022-03-04 LAB — LIPID PANEL
Cholesterol: 249 mg/dL — ABNORMAL HIGH (ref 0–200)
HDL: 57.3 mg/dL (ref >39.00)
LDL Cholesterol: 152 mg/dL — ABNORMAL HIGH (ref 0–99)
NonHDL: 191.39
Total CHOL/HDL Ratio: 4
Triglycerides: 198 mg/dL — ABNORMAL HIGH (ref 0.0–149.0)
VLDL: 39.6 mg/dL (ref 0.0–40.0)

## 2022-03-04 LAB — CBC WITH DIFFERENTIAL/PLATELET
Basophils Absolute: 0 10*3/uL (ref 0.0–0.1)
Basophils Relative: 0.4 % (ref 0.0–3.0)
Eosinophils Absolute: 0.2 10*3/uL (ref 0.0–0.7)
Eosinophils Relative: 2.4 % (ref 0.0–5.0)
HCT: 43.8 % (ref 39.0–52.0)
Hemoglobin: 14.9 g/dL (ref 13.0–17.0)
Lymphocytes Relative: 22.2 % (ref 12.0–46.0)
Lymphs Abs: 1.5 10*3/uL (ref 0.7–4.0)
MCHC: 34 g/dL (ref 30.0–36.0)
MCV: 87.8 fl (ref 78.0–100.0)
Monocytes Absolute: 0.5 10*3/uL (ref 0.1–1.0)
Monocytes Relative: 8.1 % (ref 3.0–12.0)
Neutro Abs: 4.4 10*3/uL (ref 1.4–7.7)
Neutrophils Relative %: 66.9 % (ref 43.0–77.0)
Platelets: 251 10*3/uL (ref 150.0–400.0)
RBC: 4.98 Mil/uL (ref 4.22–5.81)
RDW: 12.7 % (ref 11.5–15.5)
WBC: 6.6 10*3/uL (ref 4.0–10.5)

## 2022-03-04 LAB — TSH: TSH: 1.22 u[IU]/mL (ref 0.35–5.50)

## 2022-03-04 LAB — HEMOGLOBIN A1C: Hgb A1c MFr Bld: 5.8 % (ref 4.6–6.5)

## 2022-03-04 MED ORDER — FLUOXETINE HCL 20 MG PO CAPS: ORAL_CAPSULE | ORAL | 3 refills | Status: DC

## 2022-03-04 MED ORDER — ATORVASTATIN CALCIUM 10 MG PO TABS: 10.0000 mg | ORAL_TABLET | Freq: Every day | ORAL | 1 refills | Status: DC

## 2022-03-04 MED ORDER — LISINOPRIL 20 MG PO TABS: ORAL_TABLET | ORAL | 3 refills | Status: DC

## 2022-03-04 MED ORDER — ATOVAQUONE-PROGUANIL HCL 250-100 MG PO TABS: ORAL_TABLET | ORAL | 0 refills | Status: DC

## 2022-03-04 NOTE — Progress Notes (Signed)
? ?Subjective:  ? ? Patient ID: [name], male    DOB: Feb 06, 1981, 41 y.o.   MRN: [id number] ? ?HPI ?Here for a well exam and for advice about travelling to [place]. Next month he will be going on a 14 day trip to [place] and [place]. He is UTD on measles, mumps, rubella, hepatitis B, meningitis, and tetanus. He feels great and he has lost about 2 0 lbs by changing his diet and running 3-6 miles every day.  ? ? ?Review of Systems  ?Constitutional: Negative.   ?HENT: Negative.    ?Eyes: Negative.   ?Respiratory: Negative.    ?Cardiovascular: Negative.   ?Gastrointestinal: Negative.   ?Genitourinary: Negative.   ?Musculoskeletal: Negative.   ?Skin: Negative.   ?Neurological: Negative.   ?Psychiatric/Behavioral: Negative.    ? ?   ?Objective:  ? Physical Exam ?Constitutional:   ?   General: He is not in acute distress. ?   Appearance: Normal appearance. He is well-developed. He is not diaphoretic.  ?HENT:  ?   Head: Normocephalic and atraumatic.  ?   Right Ear: External ear normal.  ?   Left Ear: External ear normal.  ?   Nose: Nose normal.  ?   Mouth/Throat:  ?   Pharynx: No oropharyngeal exudate.  ?Eyes:  ?   General: No scleral icterus.    ?   Right eye: No discharge.     ?   Left eye: No discharge.  ?   Conjunctiva/sclera: Conjunctivae normal.  ?   Pupils: Pupils are equal, round, and reactive to light.  ?Neck:  ?   Thyroid: No thyromegaly.  ?   Vascular: No JVD.  ?   Trachea: No tracheal deviation.  ?Cardiovascular:  ?   Rate and Rhythm: Normal rate and regular rhythm.  ?   Heart sounds: Normal heart sounds. No murmur heard. ?  No friction rub. No gallop.  ?Pulmonary:  ?   Effort: Pulmonary effort is normal. No respiratory distress.  ?   Breath sounds: Normal breath sounds. No wheezing or rales.  ?Chest:  ?   Chest wall: No tenderness.  ?Abdominal:  ?   General: Bowel sounds are normal. There is no distension.  ?   Palpations: Abdomen is soft. There is no mass.  ?   Tenderness: There is no  abdominal tenderness. There is no guarding or rebound.  ?Genitourinary: ?   Penis: Normal. No tenderness.   ?   Testes: Normal.  ?Musculoskeletal:     ?   General: No tenderness. Normal range of motion.  ?   Cervical back: Neck supple.  ?Lymphadenopathy:  ?   Cervical: No cervical adenopathy.  ?Skin: ?   General: Skin is warm and dry.  ?   Coloration: Skin is not pale.  ?   Findings: No erythema or rash.  ?Neurological:  ?   Mental Status: He is alert and oriented to person, place, and time.  ?   Cranial Nerves: No cranial nerve deficit.  ?   Motor: No abnormal muscle tone.  ?   Coordination: Coordination normal.  ?   Deep Tendon Reflexes: Reflexes are normal and symmetric. Reflexes normal.  ?Psychiatric:     ?   Behavior: Behavior normal.     ?   Thought Content: Thought content normal.     ?   Judgment: Judgment normal.  ? ? ? ? ? ?   ?Assessment & Plan:  ?Well exam./ we discussed diet  and exercise. Get fasting labs. For travel, we will provide a 23 day supply of Malarone to prevent malaria. I also advised him to see the Health Department about vaccine shots against typhoid and yellow fever. ?Gershon Crane, MD ? ? ?

## 2022-07-04 ENCOUNTER — Other Ambulatory Visit: Payer: BC Managed Care – PPO

## 2022-07-12 ENCOUNTER — Other Ambulatory Visit (INDEPENDENT_AMBULATORY_CARE_PROVIDER_SITE_OTHER): Payer: BC Managed Care – PPO

## 2022-07-12 DIAGNOSIS — E785 Hyperlipidemia, unspecified: Secondary | ICD-10-CM

## 2022-07-12 LAB — HEPATIC FUNCTION PANEL
ALT: 33 U/L (ref 0–53)
AST: 24 U/L (ref 0–37)
Albumin: 4.3 g/dL (ref 3.5–5.2)
Alkaline Phosphatase: 79 U/L (ref 39–117)
Bilirubin, Direct: 0.1 mg/dL (ref 0.0–0.3)
Total Bilirubin: 0.5 mg/dL (ref 0.2–1.2)
Total Protein: 7.6 g/dL (ref 6.0–8.3)

## 2022-07-12 LAB — LIPID PANEL
Cholesterol: 258 mg/dL — ABNORMAL HIGH (ref 0–200)
HDL: 59 mg/dL (ref >39.00)
LDL Cholesterol: 167 mg/dL — ABNORMAL HIGH (ref 0–99)
NonHDL: 199.17
Total CHOL/HDL Ratio: 4
Triglycerides: 159 mg/dL — ABNORMAL HIGH (ref 0.0–149.0)
VLDL: 31.8 mg/dL (ref 0.0–40.0)

## 2022-07-12 NOTE — Addendum Note (Signed)
Addended by: Elita Boone E on: 07/12/2022 12:39 PM   Modules accepted: Orders

## 2022-07-18 ENCOUNTER — Other Ambulatory Visit: Payer: Self-pay

## 2022-07-18 DIAGNOSIS — E785 Hyperlipidemia, unspecified: Secondary | ICD-10-CM

## 2022-07-18 MED ORDER — ROSUVASTATIN CALCIUM 10 MG PO TABS: 10.0000 mg | ORAL_TABLET | Freq: Every day | ORAL | 3 refills | Status: DC

## 2022-10-19 ENCOUNTER — Other Ambulatory Visit (INDEPENDENT_AMBULATORY_CARE_PROVIDER_SITE_OTHER): Payer: BC Managed Care – PPO

## 2022-10-19 DIAGNOSIS — E785 Hyperlipidemia, unspecified: Secondary | ICD-10-CM | POA: Diagnosis not present

## 2022-10-19 LAB — LIPID PANEL
Cholesterol: 224 mg/dL — ABNORMAL HIGH (ref 0–200)
HDL: 74.4 mg/dL (ref >39.00)
LDL Cholesterol: 110 mg/dL — ABNORMAL HIGH (ref 0–99)
NonHDL: 149.79
Total CHOL/HDL Ratio: 3
Triglycerides: 197 mg/dL — ABNORMAL HIGH (ref 0.0–149.0)
VLDL: 39.4 mg/dL (ref 0.0–40.0)

## 2022-10-19 NOTE — Addendum Note (Signed)
Addended by: Elita Boone E on: 10/19/2022 10:01 AM   Modules accepted: Orders

## 2022-10-19 NOTE — Addendum Note (Signed)
Addended by: Elita Boone E on: 10/19/2022 10:02 AM   Modules accepted: Orders

## 2022-10-21 ENCOUNTER — Other Ambulatory Visit: Payer: Self-pay

## 2022-10-21 DIAGNOSIS — E785 Hyperlipidemia, unspecified: Secondary | ICD-10-CM

## 2022-10-21 MED ORDER — ROSUVASTATIN CALCIUM 20 MG PO TABS: 20.0000 mg | ORAL_TABLET | Freq: Every day | ORAL | 3 refills | Status: DC

## 2022-10-23 DIAGNOSIS — B0231 Zoster conjunctivitis: Secondary | ICD-10-CM | POA: Diagnosis not present

## 2022-10-24 DIAGNOSIS — B0233 Zoster keratitis: Secondary | ICD-10-CM | POA: Diagnosis not present

## 2022-11-11 DIAGNOSIS — B0233 Zoster keratitis: Secondary | ICD-10-CM | POA: Diagnosis not present

## 2023-01-03 DIAGNOSIS — M792 Neuralgia and neuritis, unspecified: Secondary | ICD-10-CM | POA: Diagnosis not present

## 2023-01-03 DIAGNOSIS — S90851A Superficial foreign body, right foot, initial encounter: Secondary | ICD-10-CM | POA: Diagnosis not present

## 2023-02-24 ENCOUNTER — Other Ambulatory Visit: Payer: Self-pay | Admitting: Family Medicine

## 2023-05-30 ENCOUNTER — Other Ambulatory Visit: Payer: Self-pay | Admitting: Family Medicine

## 2023-06-28 ENCOUNTER — Other Ambulatory Visit: Payer: Self-pay | Admitting: Family Medicine

## 2023-08-03 ENCOUNTER — Other Ambulatory Visit: Payer: Self-pay | Admitting: Family Medicine

## 2023-09-07 ENCOUNTER — Other Ambulatory Visit: Payer: Self-pay | Admitting: Family Medicine

## 2023-10-02 ENCOUNTER — Other Ambulatory Visit: Payer: Self-pay | Admitting: Family Medicine

## 2023-10-10 ENCOUNTER — Encounter: Payer: BC Managed Care – PPO | Admitting: Family Medicine

## 2023-10-10 ENCOUNTER — Other Ambulatory Visit: Payer: Self-pay | Admitting: Family Medicine

## 2023-10-13 ENCOUNTER — Other Ambulatory Visit: Payer: Self-pay | Admitting: Family Medicine

## 2023-10-13 DIAGNOSIS — E785 Hyperlipidemia, unspecified: Secondary | ICD-10-CM

## 2023-10-20 ENCOUNTER — Encounter: Payer: Self-pay | Admitting: Family Medicine

## 2023-10-20 ENCOUNTER — Ambulatory Visit (INDEPENDENT_AMBULATORY_CARE_PROVIDER_SITE_OTHER): Payer: BC Managed Care – PPO | Admitting: Family Medicine

## 2023-10-20 VITALS — BP 120/78 | HR 78 | Temp 98.2°F | Ht 70.5 in | Wt 277.0 lb

## 2023-10-20 DIAGNOSIS — Z0000 Encounter for general adult medical examination without abnormal findings: Secondary | ICD-10-CM | POA: Diagnosis not present

## 2023-10-20 DIAGNOSIS — Z23 Encounter for immunization: Secondary | ICD-10-CM

## 2023-10-20 LAB — CBC WITH DIFFERENTIAL/PLATELET
Basophils Absolute: 0 10*3/uL (ref 0.0–0.1)
Basophils Relative: 0.5 % (ref 0.0–3.0)
Eosinophils Absolute: 0.1 10*3/uL (ref 0.0–0.7)
Eosinophils Relative: 1.3 % (ref 0.0–5.0)
HCT: 45 % (ref 39.0–52.0)
Hemoglobin: 15.2 g/dL (ref 13.0–17.0)
Lymphocytes Relative: 21.3 % (ref 12.0–46.0)
Lymphs Abs: 1.4 10*3/uL (ref 0.7–4.0)
MCHC: 33.8 g/dL (ref 30.0–36.0)
MCV: 90.4 fL (ref 78.0–100.0)
Monocytes Absolute: 0.4 10*3/uL (ref 0.1–1.0)
Monocytes Relative: 6.6 % (ref 3.0–12.0)
Neutro Abs: 4.6 10*3/uL (ref 1.4–7.7)
Neutrophils Relative %: 70.3 % (ref 43.0–77.0)
Platelets: 243 10*3/uL (ref 150.0–400.0)
RBC: 4.98 Mil/uL (ref 4.22–5.81)
RDW: 13.7 % (ref 11.5–15.5)
WBC: 6.6 10*3/uL (ref 4.0–10.5)

## 2023-10-20 LAB — BASIC METABOLIC PANEL
BUN: 12 mg/dL (ref 6–23)
CO2: 30 meq/L (ref 19–32)
Calcium: 9.1 mg/dL (ref 8.4–10.5)
Chloride: 99 meq/L (ref 96–112)
Creatinine, Ser: 0.75 mg/dL (ref 0.40–1.50)
GFR: 112.66 mL/min (ref >60.00)
Glucose, Bld: 103 mg/dL — ABNORMAL HIGH (ref 70–99)
Potassium: 4.6 meq/L (ref 3.5–5.1)
Sodium: 139 meq/L (ref 135–145)

## 2023-10-20 LAB — HEPATIC FUNCTION PANEL
ALT: 33 U/L (ref 0–53)
AST: 25 U/L (ref 0–37)
Albumin: 4.6 g/dL (ref 3.5–5.2)
Alkaline Phosphatase: 78 U/L (ref 39–117)
Bilirubin, Direct: 0.1 mg/dL (ref 0.0–0.3)
Total Bilirubin: 0.5 mg/dL (ref 0.2–1.2)
Total Protein: 7.4 g/dL (ref 6.0–8.3)

## 2023-10-20 LAB — LIPID PANEL
Cholesterol: 203 mg/dL — ABNORMAL HIGH (ref 0–200)
HDL: 92.6 mg/dL (ref >39.00)
LDL Cholesterol: 86 mg/dL (ref 0–99)
NonHDL: 110.07
Total CHOL/HDL Ratio: 2
Triglycerides: 118 mg/dL (ref 0.0–149.0)
VLDL: 23.6 mg/dL (ref 0.0–40.0)

## 2023-10-20 LAB — TSH: TSH: 0.98 u[IU]/mL (ref 0.35–5.50)

## 2023-10-20 LAB — HEMOGLOBIN A1C: Hgb A1c MFr Bld: 5.9 % (ref 4.6–6.5)

## 2023-10-20 MED ORDER — FLUOXETINE HCL 20 MG PO CAPS: ORAL_CAPSULE | ORAL | 3 refills | Status: DC

## 2023-10-20 MED ORDER — PHENTERMINE HCL 37.5 MG PO CAPS: 37.5000 mg | ORAL_CAPSULE | ORAL | 1 refills | Status: DC

## 2023-10-20 NOTE — Progress Notes (Signed)
Subjective:    Patient ID: [name], male    DOB: 15-Mar-1981, 42 y.o.   MRN: [id number]  HPI Here for a well exam. He feels fine but he is frustrated by his inability to lose weight. He tries to exercise some but he finds it hard to keep from snacking.    Review of Systems  Constitutional: Negative.   HENT: Negative.    Eyes: Negative.   Respiratory: Negative.    Cardiovascular: Negative.   Gastrointestinal: Negative.   Genitourinary: Negative.   Musculoskeletal: Negative.   Skin: Negative.   Neurological: Negative.   Psychiatric/Behavioral: Negative.         Objective:   Physical Exam Constitutional:      General: He is not in acute distress.    Appearance: He is well-developed. He is obese. He is not diaphoretic.  HENT:     Head: Normocephalic and atraumatic.     Right Ear: External ear normal.     Left Ear: External ear normal.     Nose: Nose normal.     Mouth/Throat:     Pharynx: No oropharyngeal exudate.  Eyes:     General: No scleral icterus.       Right eye: No discharge.        Left eye: No discharge.     Conjunctiva/sclera: Conjunctivae normal.     Pupils: Pupils are equal, round, and reactive to light.  Neck:     Thyroid: No thyromegaly.     Vascular: No JVD.     Trachea: No tracheal deviation.  Cardiovascular:     Rate and Rhythm: Normal rate and regular rhythm.     Pulses: Normal pulses.     Heart sounds: Normal heart sounds. No murmur heard.    No friction rub. No gallop.  Pulmonary:     Effort: Pulmonary effort is normal. No respiratory distress.     Breath sounds: Normal breath sounds. No wheezing or rales.  Chest:     Chest wall: No tenderness.  Abdominal:     General: Bowel sounds are normal. There is no distension.     Palpations: Abdomen is soft. There is no mass.     Tenderness: There is no abdominal tenderness. There is no guarding or rebound.  Genitourinary:    Penis: Normal. No tenderness.      Testes: Normal.   Musculoskeletal:        General: No tenderness. Normal range of motion.     Cervical back: Neck supple.  Lymphadenopathy:     Cervical: No cervical adenopathy.  Skin:    General: Skin is warm and dry.     Coloration: Skin is not pale.     Findings: No erythema or rash.  Neurological:     General: No focal deficit present.     Mental Status: He is alert and oriented to person, place, and time.     Cranial Nerves: No cranial nerve deficit.     Motor: No abnormal muscle tone.     Coordination: Coordination normal.     Deep Tendon Reflexes: Reflexes are normal and symmetric. Reflexes normal.  Psychiatric:        Mood and Affect: Mood normal.        Behavior: Behavior normal.        Thought Content: Thought content normal.        Judgment: Judgment normal.           Assessment & Plan:  Well exam.  We discussed diet and exercise. Get fasting labs. For the obesity, he will try Phentermine 37.5 mg each morning.  Gershon Crane, MD

## 2023-10-20 NOTE — Addendum Note (Signed)
Addended by: Carola Rhine on: 10/20/2023 02:44 PM   Modules accepted: Orders

## 2023-11-09 ENCOUNTER — Other Ambulatory Visit: Payer: Self-pay | Admitting: Family Medicine

## 2024-01-07 ENCOUNTER — Other Ambulatory Visit: Payer: Self-pay | Admitting: Family Medicine

## 2024-01-16 ENCOUNTER — Other Ambulatory Visit: Payer: Self-pay | Admitting: Family Medicine

## 2024-03-20 ENCOUNTER — Other Ambulatory Visit: Payer: Self-pay | Admitting: Family Medicine

## 2024-04-14 ENCOUNTER — Other Ambulatory Visit: Payer: Self-pay | Admitting: Family Medicine

## 2024-05-22 ENCOUNTER — Other Ambulatory Visit: Payer: Self-pay | Admitting: Family Medicine

## 2024-07-27 ENCOUNTER — Other Ambulatory Visit: Payer: Self-pay | Admitting: Family Medicine

## 2024-08-02 ENCOUNTER — Other Ambulatory Visit: Payer: Self-pay | Admitting: Family Medicine

## 2024-08-02 DIAGNOSIS — E785 Hyperlipidemia, unspecified: Secondary | ICD-10-CM

## 2024-09-19 ENCOUNTER — Other Ambulatory Visit: Payer: Self-pay | Admitting: Family Medicine

## 2024-09-19 NOTE — Telephone Encounter (Unsigned)
 Copied from CRM [id number]. Topic: Clinical - Medication Refill >> Sep 19, 2024 10:48 AM Burnard DEL wrote: Medication: lisinopril  (ZESTRIL ) 20 MG tablet   phentermine  37.5 MG capsule   Has the patient contacted their pharmacy? No (Agent: If no, request that the patient contact the pharmacy for the refill. If patient does not wish to contact the pharmacy document the reason why and proceed with request.) (Agent: If yes, when and what did the pharmacy advise?)  This is the patient's preferred pharmacy:  [id number] - [place], [place] - [address] AT [facility] [address] [place] [address] Phone: [phone] Fax: [phone]  Is this the correct pharmacy for this prescription? Yes If no, delete pharmacy and type the correct one.   Has the prescription been filled recently? No  Is the patient out of the medication? Yes  Has the patient been seen for an appointment in the last year OR does the patient have an upcoming appointment? Yes  Can we respond through MyChart? Yes  Agent: Please be advised that Rx refills may take up to 3 business days. We ask that you follow-up with your pharmacy.

## 2024-09-20 MED ORDER — LISINOPRIL 20 MG PO TABS: ORAL_TABLET | ORAL | 3 refills | Status: AC

## 2024-09-20 MED ORDER — PHENTERMINE HCL 37.5 MG PO CAPS: 37.5000 mg | ORAL_CAPSULE | ORAL | 1 refills | Status: AC

## 2024-10-27 ENCOUNTER — Other Ambulatory Visit: Payer: Self-pay | Admitting: Family Medicine

## 2024-11-11 ENCOUNTER — Encounter: Admitting: Family Medicine

## 2024-11-19 ENCOUNTER — Ambulatory Visit: Admitting: Family Medicine

## 2024-11-19 ENCOUNTER — Encounter: Payer: Self-pay | Admitting: Family Medicine

## 2024-11-19 VITALS — BP 124/80 | HR 81 | Temp 98.1°F | Ht 70.75 in | Wt 274.0 lb

## 2024-11-19 DIAGNOSIS — Z23 Encounter for immunization: Secondary | ICD-10-CM | POA: Diagnosis not present

## 2024-11-19 DIAGNOSIS — Z0000 Encounter for general adult medical examination without abnormal findings: Secondary | ICD-10-CM | POA: Diagnosis not present

## 2024-11-19 LAB — BASIC METABOLIC PANEL WITH GFR
BUN: 11 mg/dL (ref 6–23)
CO2: 24 meq/L (ref 19–32)
Calcium: 9.2 mg/dL (ref 8.4–10.5)
Chloride: 99 meq/L (ref 96–112)
Creatinine, Ser: 0.76 mg/dL (ref 0.40–1.50)
GFR: 111.36 mL/min
Glucose, Bld: 107 mg/dL — ABNORMAL HIGH (ref 70–99)
Potassium: 3.9 meq/L (ref 3.5–5.1)
Sodium: 136 meq/L (ref 135–145)

## 2024-11-19 LAB — CBC WITH DIFFERENTIAL/PLATELET
Basophils Absolute: 0 K/uL (ref 0.0–0.1)
Basophils Relative: 0.4 % (ref 0.0–3.0)
Eosinophils Absolute: 0.1 K/uL (ref 0.0–0.7)
Eosinophils Relative: 2.1 % (ref 0.0–5.0)
HCT: 43 % (ref 39.0–52.0)
Hemoglobin: 14.8 g/dL (ref 13.0–17.0)
Lymphocytes Relative: 19.6 % (ref 12.0–46.0)
Lymphs Abs: 1.2 K/uL (ref 0.7–4.0)
MCHC: 34.5 g/dL (ref 30.0–36.0)
MCV: 90.2 fl (ref 78.0–100.0)
Monocytes Absolute: 0.6 K/uL (ref 0.1–1.0)
Monocytes Relative: 8.7 % (ref 3.0–12.0)
Neutro Abs: 4.4 K/uL (ref 1.4–7.7)
Neutrophils Relative %: 69.2 % (ref 43.0–77.0)
Platelets: 212 K/uL (ref 150.0–400.0)
RBC: 4.77 Mil/uL (ref 4.22–5.81)
RDW: 13.1 % (ref 11.5–15.5)
WBC: 6.3 K/uL (ref 4.0–10.5)

## 2024-11-19 LAB — HEPATIC FUNCTION PANEL
ALT: 98 U/L — ABNORMAL HIGH (ref 3–53)
AST: 91 U/L — ABNORMAL HIGH (ref 5–37)
Albumin: 4.6 g/dL (ref 3.5–5.2)
Alkaline Phosphatase: 62 U/L (ref 39–117)
Bilirubin, Direct: 0.2 mg/dL (ref 0.1–0.3)
Total Bilirubin: 0.7 mg/dL (ref 0.2–1.2)
Total Protein: 7.8 g/dL (ref 6.0–8.3)

## 2024-11-19 LAB — LIPID PANEL
Cholesterol: 167 mg/dL (ref 28–200)
HDL: 76.9 mg/dL
LDL Cholesterol: 59 mg/dL (ref 10–99)
NonHDL: 89.71
Total CHOL/HDL Ratio: 2
Triglycerides: 153 mg/dL — ABNORMAL HIGH (ref 10.0–149.0)
VLDL: 30.6 mg/dL (ref 0.0–40.0)

## 2024-11-19 LAB — TSH: TSH: 0.58 u[IU]/mL (ref 0.35–5.50)

## 2024-11-19 LAB — HEMOGLOBIN A1C: Hgb A1c MFr Bld: 5.8 % (ref 4.6–6.5)

## 2024-11-19 NOTE — Progress Notes (Signed)
 "  Subjective:    Patient ID: [name], male    DOB: 09/21/1981, 44 y.o.   MRN: [id number]  HPI Here for a well exam. He feels fine.    Review of Systems  Constitutional: Negative.   HENT: Negative.    Eyes: Negative.   Respiratory: Negative.    Cardiovascular: Negative.   Gastrointestinal: Negative.   Genitourinary: Negative.   Musculoskeletal: Negative.   Skin: Negative.   Neurological: Negative.   Psychiatric/Behavioral: Negative.         Objective:   Physical Exam Constitutional:      General: He is not in acute distress.    Appearance: He is well-developed. He is obese. He is not diaphoretic.  HENT:     Head: Normocephalic and atraumatic.     Right Ear: External ear normal.     Left Ear: External ear normal.     Nose: Nose normal.     Mouth/Throat:     Pharynx: No oropharyngeal exudate.  Eyes:     General: No scleral icterus.       Right eye: No discharge.        Left eye: No discharge.     Conjunctiva/sclera: Conjunctivae normal.     Pupils: Pupils are equal, round, and reactive to light.  Neck:     Thyroid: No thyromegaly.     Vascular: No JVD.     Trachea: No tracheal deviation.  Cardiovascular:     Rate and Rhythm: Normal rate and regular rhythm.     Pulses: Normal pulses.     Heart sounds: Normal heart sounds. No murmur heard.    No friction rub. No gallop.  Pulmonary:     Effort: Pulmonary effort is normal. No respiratory distress.     Breath sounds: Normal breath sounds. No wheezing or rales.  Chest:     Chest wall: No tenderness.  Abdominal:     General: Bowel sounds are normal. There is no distension.     Palpations: Abdomen is soft. There is no mass.     Tenderness: There is no abdominal tenderness. There is no guarding or rebound.  Genitourinary:    Penis: Normal. No tenderness.      Testes: Normal.     Prostate: Normal.     Rectum: Normal. Guaiac result negative.  Musculoskeletal:        General: No tenderness. Normal  range of motion.     Cervical back: Neck supple.  Lymphadenopathy:     Cervical: No cervical adenopathy.  Skin:    General: Skin is warm and dry.     Coloration: Skin is not pale.     Findings: No erythema or rash.  Neurological:     General: No focal deficit present.     Mental Status: He is alert and oriented to person, place, and time.     Cranial Nerves: No cranial nerve deficit.     Motor: No abnormal muscle tone.     Coordination: Coordination normal.     Deep Tendon Reflexes: Reflexes are normal and symmetric. Reflexes normal.  Psychiatric:        Mood and Affect: Mood normal.        Behavior: Behavior normal.        Thought Content: Thought content normal.        Judgment: Judgment normal.           Assessment & Plan:  Well exam. We discussed diet and exercise. Get fasting labs.  Garnette Olmsted, MD   "

## 2024-11-19 NOTE — Addendum Note (Signed)
 Addended by: LADONNA INOCENTE SAILOR on: 11/19/2024 02:15 PM   Modules accepted: Orders

## 2024-11-20 ENCOUNTER — Ambulatory Visit: Payer: Self-pay | Admitting: Family Medicine

## 2024-11-20 DIAGNOSIS — R748 Abnormal levels of other serum enzymes: Secondary | ICD-10-CM
